# Patient Record
Sex: Male | Born: 1975 | Race: Black or African American | Hispanic: No | Marital: Married | Smoking: Current every day smoker
Health system: Southern US, Community
[De-identification: ages and names within clinical notes are randomized; demographics above are authoritative.]

## PROBLEM LIST (undated history)

## (undated) DIAGNOSIS — G4730 Sleep apnea, unspecified: Secondary | ICD-10-CM

## (undated) DIAGNOSIS — I10 Essential (primary) hypertension: Secondary | ICD-10-CM

---

## 2020-03-18 ENCOUNTER — Inpatient Hospital Stay
Admission: EM | Admit: 2020-03-18 | Discharge: 2020-03-19 | DRG: 392 | Discharge status: Against Medical Advice | Attending: Family Medicine | Admitting: Family Medicine

## 2020-03-18 ENCOUNTER — Other Ambulatory Visit: Payer: Self-pay

## 2020-03-18 ENCOUNTER — Encounter: Payer: Self-pay | Admitting: Emergency Medicine

## 2020-03-18 ENCOUNTER — Emergency Department

## 2020-03-18 DIAGNOSIS — K219 Gastro-esophageal reflux disease without esophagitis: Secondary | ICD-10-CM | POA: Diagnosis present

## 2020-03-18 DIAGNOSIS — I10 Essential (primary) hypertension: Secondary | ICD-10-CM

## 2020-03-18 DIAGNOSIS — R197 Diarrhea, unspecified: Secondary | ICD-10-CM | POA: Diagnosis present

## 2020-03-18 DIAGNOSIS — R112 Nausea with vomiting, unspecified: Secondary | ICD-10-CM | POA: Diagnosis not present

## 2020-03-18 DIAGNOSIS — G4730 Sleep apnea, unspecified: Secondary | ICD-10-CM | POA: Diagnosis present

## 2020-03-18 DIAGNOSIS — M109 Gout, unspecified: Secondary | ICD-10-CM | POA: Diagnosis present

## 2020-03-18 DIAGNOSIS — K56609 Unspecified intestinal obstruction, unspecified as to partial versus complete obstruction: Secondary | ICD-10-CM

## 2020-03-18 DIAGNOSIS — A09 Infectious gastroenteritis and colitis, unspecified: Secondary | ICD-10-CM | POA: Diagnosis present

## 2020-03-18 DIAGNOSIS — Z20822 Contact with and (suspected) exposure to covid-19: Secondary | ICD-10-CM | POA: Diagnosis present

## 2020-03-18 DIAGNOSIS — F17200 Nicotine dependence, unspecified, uncomplicated: Secondary | ICD-10-CM | POA: Diagnosis present

## 2020-03-18 HISTORY — DX: Essential (primary) hypertension: I10

## 2020-03-18 HISTORY — DX: Sleep apnea, unspecified: G47.30

## 2020-03-18 LAB — CBC
HCT: 48.6 % (ref 39.0–52.0)
HCT: 46.3 % (ref 39.0–52.0)
Hemoglobin: 15.3 g/dL (ref 13.0–17.0)
Hemoglobin: 14.6 g/dL (ref 13.0–17.0)
MCH: 23.3 pg — ABNORMAL LOW (ref 26.0–34.0)
MCH: 23.4 pg — ABNORMAL LOW (ref 26.0–34.0)
MCHC: 31.5 g/dL (ref 30.0–36.0)
MCHC: 31.5 g/dL (ref 30.0–36.0)
MCV: 73.9 fL — ABNORMAL LOW (ref 80.0–100.0)
MCV: 74.2 fL — ABNORMAL LOW (ref 80.0–100.0)
Platelets: 311 10*3/uL (ref 150–400)
Platelets: 280 K/uL (ref 150–400)
RBC: 6.58 MIL/uL — ABNORMAL HIGH (ref 4.22–5.81)
RBC: 6.24 MIL/uL — ABNORMAL HIGH (ref 4.22–5.81)
RDW: 15.8 % — ABNORMAL HIGH (ref 11.5–15.5)
RDW: 15.1 % (ref 11.5–15.5)
WBC: 7.8 10*3/uL (ref 4.0–10.5)
WBC: 7.7 K/uL (ref 4.0–10.5)
nRBC: 0 % (ref 0.0–0.2)
nRBC: 0 % (ref 0.0–0.2)

## 2020-03-18 LAB — URINALYSIS, COMPLETE (UACMP) WITH MICROSCOPIC
Bacteria, UA: NONE SEEN
Bilirubin Urine: NEGATIVE
Glucose, UA: NEGATIVE mg/dL
Hgb urine dipstick: NEGATIVE
Ketones, ur: NEGATIVE mg/dL
Nitrite: NEGATIVE
Protein, ur: 30 mg/dL — AB
Specific Gravity, Urine: 1.038 — ABNORMAL HIGH (ref 1.005–1.030)
pH: 7 (ref 5.0–8.0)

## 2020-03-18 LAB — COMPREHENSIVE METABOLIC PANEL
ALT: 39 U/L (ref 0–44)
AST: 38 U/L (ref 15–41)
Albumin: 4 g/dL (ref 3.5–5.0)
Alkaline Phosphatase: 79 U/L (ref 38–126)
Anion gap: 12 (ref 5–15)
BUN: 11 mg/dL (ref 6–20)
CO2: 32 mmol/L (ref 22–32)
Calcium: 9.2 mg/dL (ref 8.9–10.3)
Chloride: 94 mmol/L — ABNORMAL LOW (ref 98–111)
Creatinine, Ser: 1.4 mg/dL — ABNORMAL HIGH (ref 0.61–1.24)
GFR calc Af Amer: >60 mL/min (ref >60)
GFR calc non Af Amer: 60 mL/min — ABNORMAL LOW (ref >60)
Glucose, Bld: 132 mg/dL — ABNORMAL HIGH (ref 70–99)
Potassium: 3.9 mmol/L (ref 3.5–5.1)
Sodium: 138 mmol/L (ref 135–145)
Total Bilirubin: 2.2 mg/dL — ABNORMAL HIGH (ref 0.3–1.2)
Total Protein: 8.1 g/dL (ref 6.5–8.1)

## 2020-03-18 LAB — LIPASE, BLOOD: Lipase: 34 U/L (ref 11–51)

## 2020-03-18 LAB — CREATININE, SERUM
Creatinine, Ser: 1.35 mg/dL — ABNORMAL HIGH (ref 0.61–1.24)
GFR calc Af Amer: >60 mL/min (ref >60)
GFR calc non Af Amer: >60 mL/min (ref >60)

## 2020-03-18 LAB — SARS CORONAVIRUS 2 BY RT PCR (HOSPITAL ORDER, PERFORMED IN ~~LOC~~ HOSPITAL LAB): SARS Coronavirus 2: NEGATIVE

## 2020-03-18 MED ORDER — SODIUM CHLORIDE 0.9 % IV BOLUS
1000.0000 mL | Freq: Once | INTRAVENOUS | Status: AC
  Administered 2020-03-18: 1000 mL via INTRAVENOUS

## 2020-03-18 MED ORDER — SODIUM CHLORIDE 0.9 % IV SOLN
1000.0000 mL | Freq: Once | INTRAVENOUS | Status: AC
  Administered 2020-03-18: 1000 mL via INTRAVENOUS

## 2020-03-18 MED ORDER — MORPHINE SULFATE (PF) 2 MG/ML IV SOLN: 2.0000 mg | INTRAVENOUS | Status: DC | PRN

## 2020-03-18 MED ORDER — MORPHINE SULFATE (PF) 4 MG/ML IV SOLN
4.0000 mg | Freq: Once | INTRAVENOUS | Status: AC
  Administered 2020-03-18: 4 mg via INTRAVENOUS
  Filled 2020-03-18: qty 1

## 2020-03-18 MED ORDER — LORATADINE 10 MG PO TABS
10.0000 mg | ORAL_TABLET | Freq: Every day | ORAL | Status: DC
  Administered 2020-03-18 – 2020-03-19 (×2): 10 mg via ORAL
  Filled 2020-03-18 (×2): qty 1

## 2020-03-18 MED ORDER — POTASSIUM CHLORIDE 2 MEQ/ML IV SOLN
INTRAVENOUS | Status: DC
  Filled 2020-03-18 (×12): qty 1000

## 2020-03-18 MED ORDER — PANTOPRAZOLE SODIUM 40 MG IV SOLR
40.0000 mg | Freq: Every day | INTRAVENOUS | Status: DC
  Administered 2020-03-18: 40 mg via INTRAVENOUS
  Filled 2020-03-18: qty 40

## 2020-03-18 MED ORDER — PROMETHAZINE HCL 25 MG PO TABS: 12.5000 mg | ORAL_TABLET | Freq: Four times a day (QID) | ORAL | Status: DC | PRN

## 2020-03-18 MED ORDER — LOSARTAN POTASSIUM 50 MG PO TABS
100.0000 mg | ORAL_TABLET | Freq: Every day | ORAL | Status: DC
  Administered 2020-03-18 – 2020-03-19 (×2): 100 mg via ORAL
  Filled 2020-03-18 (×2): qty 2

## 2020-03-18 MED ORDER — ONDANSETRON HCL 4 MG/2ML IJ SOLN
4.0000 mg | Freq: Once | INTRAMUSCULAR | Status: AC
  Administered 2020-03-18: 4 mg via INTRAVENOUS
  Filled 2020-03-18: qty 2

## 2020-03-18 MED ORDER — AMLODIPINE BESYLATE 10 MG PO TABS
10.0000 mg | ORAL_TABLET | Freq: Every day | ORAL | Status: DC
  Administered 2020-03-18 – 2020-03-19 (×2): 10 mg via ORAL
  Filled 2020-03-18: qty 2
  Filled 2020-03-18: qty 1

## 2020-03-18 MED ORDER — ENOXAPARIN SODIUM 40 MG/0.4ML ~~LOC~~ SOLN
40.0000 mg | Freq: Two times a day (BID) | SUBCUTANEOUS | Status: DC
  Filled 2020-03-18 (×2): qty 0.4

## 2020-03-18 MED ORDER — IOHEXOL 300 MG/ML  SOLN
125.0000 mL | Freq: Once | INTRAMUSCULAR | Status: AC | PRN
  Administered 2020-03-18: 125 mL via INTRAVENOUS
  Filled 2020-03-18: qty 125

## 2020-03-18 NOTE — ED Triage Notes (Signed)
Pt reports abd pain since Friday am. Pt reports pain is constant, all over abd and crampy in nature.

## 2020-03-18 NOTE — ED Notes (Signed)
Pt being transported to CT

## 2020-03-18 NOTE — ED Notes (Signed)
Per Dr. Luberta Robertson, pt does not need gastric tube at this time

## 2020-03-18 NOTE — Consult Note (Signed)
SURGICAL CONSULTATION NOTE   HISTORY OF PRESENT ILLNESS (HPI):  44 y.o. male presented to [facility] ED for evaluation of abdominal pain, nausea and vomiting. Patient reports he was having a sleep apnea study with CPAP on Friday.  In the morning after the study he started having pain in the anterior abdomen.  The pain does not radiate to the probably body.  There has been no alleviating or aggravating factor.  The patient reported having associated nausea and vomiting and diarrhea.  The patient does endorses decreasing flatus but still passing flatus.  Patient denies fever.  Patient cannot recall anything that he ate recently that aggravate his pain.  At the ED he had labs that shows elevated creatinine.  No significant leukocytosis, normal hemoglobin.  The patient also had CT scan that shows small bowel dilation and transition point in a short segment of the small bowel with hyperenhancement.  I personally evaluated the images.  There is no free air or free fluid.  Surgery is consulted by Dr. Cyril Loosen in this context for evaluation and management of small bowel obstruction due to enteritis.  PAST MEDICAL HISTORY (PMH):  Past Medical History:  Diagnosis Date  . Hypertension   . Sleep apnea      PAST SURGICAL HISTORY (PSH):  History reviewed. No pertinent surgical history.   MEDICATIONS:  Prior to Admission medications   Not on File     ALLERGIES:  No Known Allergies   SOCIAL HISTORY:  Social History   Socioeconomic History  . Marital status: Married    Spouse name: Not on file  . Number of children: Not on file  . Years of education: Not on file  . Highest education level: Not on file  Occupational History  . Not on file  Tobacco Use  . Smoking status: Not on file  Substance and Sexual Activity  . Alcohol use: Not on file  . Drug use: Not on file  . Sexual activity: Not on file  Other Topics Concern  . Not on file  Social History Narrative  . Not on file   Social  Determinants of Health   Financial Resource Strain:   . Difficulty of Paying Living Expenses:   Food Insecurity:   . Worried About Programme researcher, broadcasting/film/video in the Last Year:   . Barista in the Last Year:   Transportation Needs:   . Freight forwarder (Medical):   Marland Kitchen Lack of Transportation (Non-Medical):   Physical Activity:   . Days of Exercise per Week:   . Minutes of Exercise per Session:   Stress:   . Feeling of Stress :   Social Connections:   . Frequency of Communication with Friends and Family:   . Frequency of Social Gatherings with Friends and Family:   . Attends Religious Services:   . Active Member of Clubs or Organizations:   . Attends Banker Meetings:   Marland Kitchen Marital Status:   Intimate Partner Violence:   . Fear of Current or Ex-Partner:   . Emotionally Abused:   Marland Kitchen Physically Abused:   . Sexually Abused:       FAMILY HISTORY:  No family history on file.   REVIEW OF SYSTEMS:  Constitutional: denies weight loss, fever, chills, or sweats  Eyes: denies any other vision changes, history of eye injury  ENT: denies sore throat, hearing problems  Respiratory: denies shortness of breath, wheezing  Cardiovascular: denies chest pain, palpitations  Gastrointestinal: Positive abdominal pain, nausea  and vomiting Genitourinary: denies burning with urination or urinary frequency Musculoskeletal: denies any other joint pains or cramps  Skin: denies any other rashes or skin discolorations  Neurological: denies any other headache, dizziness, weakness  Psychiatric: denies any other depression, anxiety   All other review of systems were negative   VITAL SIGNS:  Temp:  [98.3 F (36.8 C)] 98.3 F (36.8 C) (07/25 0956) Pulse Rate:  [88] 88 (07/25 0956) Resp:  [20] 20 (07/25 0956) BP: (144)/(98) 144/98 (07/25 0956) SpO2:  [94 %] 94 % (07/25 0956) Weight:  [145.6 kg] 145.6 kg (07/25 0954)     Height: 6' (182.9 cm) Weight: (!) 145.6 kg BMI (Calculated): 43.53    INTAKE/OUTPUT:  This shift: No intake/output data recorded.  Last 2 shifts: @IOLAST2SHIFTS @   PHYSICAL EXAM:  Constitutional:  -- Normal body habitus  -- Awake, alert, and oriented x3  Eyes:  -- Pupils equally round and reactive to light  -- No scleral icterus  Ear, nose, and throat:  -- No jugular venous distension  Pulmonary:  -- No crackles  -- Equal breath sounds bilaterally -- Breathing non-labored at rest Cardiovascular:  -- S1, S2 present  -- No pericardial rubs Gastrointestinal:  -- Abdomen soft, nontender, non-distended, no guarding or rebound tenderness -- No abdominal masses appreciated, pulsatile or otherwise  Musculoskeletal and Integumentary:  -- Wounds: None appreciated -- Extremities: B/L UE and LE FROM, hands and feet warm, no edema  Neurologic:  -- Motor function: intact and symmetric -- Sensation: intact and symmetric   Labs:  CBC Latest Ref Rng & Units 03/18/2020  WBC 4.0 - 10.5 K/uL 7.8  Hemoglobin 13.0 - 17.0 g/dL 03/20/2020  Hematocrit 39 - 52 % 48.6  Platelets 150 - 400 K/uL 311   CMP Latest Ref Rng & Units 03/18/2020  Glucose 70 - 99 mg/dL 03/20/2020)  BUN 6 - 20 mg/dL 11  Creatinine 935(T - 0.17 mg/dL 7.93)  Sodium 9.03(E - 092 mmol/L 138  Potassium 3.5 - 5.1 mmol/L 3.9  Chloride 98 - 111 mmol/L 94(L)  CO2 22 - 32 mmol/L 32  Calcium 8.9 - 10.3 mg/dL 9.2  Total Protein 6.5 - 8.1 g/dL 8.1  Total Bilirubin 0.3 - 1.2 mg/dL 2.2(H)  Alkaline Phos 38 - 126 U/L 79  AST 15 - 41 U/L 38  ALT 0 - 44 U/L 39   Imaging studies:  EXAM: CT ABDOMEN AND PELVIS WITH CONTRAST  TECHNIQUE: Multidetector CT imaging of the abdomen and pelvis was performed using the standard protocol following bolus administration of intravenous contrast.  CONTRAST:  330 OMNIPAQUE IOHEXOL 300 MG/ML  SOLN  COMPARISON:  No priors.  FINDINGS: Lower chest: Dependent subsegmental atelectasis in the lower lobes of the lungs bilaterally.  Hepatobiliary: Diffuse low  attenuation throughout the hepatic parenchyma, indicative of hepatic steatosis. No discrete cystic or solid hepatic lesions. No intra or extrahepatic biliary ductal dilatation. Gallbladder is normal in appearance.  Pancreas: No pancreatic mass. No pancreatic ductal dilatation. No pancreatic or peripancreatic fluid collections or inflammatory changes.  Spleen: Unremarkable.  Adrenals/Urinary Tract: Subcentimeter low-attenuation lesion in the lower pole of the right kidney, too small to characterize, but statistically likely to represent a cyst. Left kidney and bilateral adrenal glands are normal in appearance. No hydroureteronephrosis. Urinary bladder is unremarkable in appearance.  Stomach/Bowel: The appearance of the stomach is normal. Multiple prominent borderline dilated and mildly dilated loops of small bowel are noted, predominantly the jejunum, measuring up to 4.3 cm in diameter with some air-fluid  levels. More distal aspect of the small bowel and the colon are otherwise nearly completely decompressed. On coronal images 23-41 of series 5 there is a transition from dilated to nondilated small bowel. The cause of this transition is of uncertain on today's examination there are some portions of the small bowel in the right lower quadrant of the abdomen which demonstrate apparent increased mucosal enhancement and probable mural thickening, best appreciated on axial images 70-87 of series 2. Terminal ileum is grossly unremarkable in appearance. Normal appendix.  Vascular/Lymphatic: Atherosclerosis in the pelvic vasculature. No evidence of aneurysm or dissection in the abdominal or pelvic vasculature. Retroaortic left renal vein (normal anatomical variant) incidentally noted. No lymphadenopathy noted in the abdomen or pelvis.  Reproductive: Prostate gland and seminal vesicles are unremarkable in appearance.  Other: No significant volume of ascites.  No  pneumoperitoneum.  Musculoskeletal: There are no aggressive appearing lytic or blastic lesions noted in the visualized portions of the skeleton.  IMPRESSION: 1. Findings are compatible with small bowel obstruction, as detailed above. A transition point is identified, although the exact cause of obstruction is uncertain on today's examination. There are some areas in the ileum which demonstrate apparent mural thickening and mucosal hyperenhancement, without direct involvement of the terminal ileum itself. Findings could suggest a small bowel enteritis, but given the lack of involvement of the terminal ileum, Crohn's disease is not favored. Further clinical evaluation is recommended. 2. Hepatic steatosis. 3. Atherosclerosis.   Electronically Signed   By: Trudie Reed M.D.   On: 03/18/2020 13:31  Assessment/Plan:  44 y.o. male with enteritis causing small bowel obstruction, complicated by pertinent comorbidities including sleep apnea.  Patient presenting with clinical symptoms of small bowel obstruction.  I considered that the cause of the small bowel obstruction without any previous abdominal surgery and a segmental area of the small intestine with hyperenhancement is due to enteritis.  Differential diagnosis is infectious versus inflammatory enteritis.  I recommend to place NGT due to patient abdominal distention and nausea.  Agree with IV hydration.  This will help relieving the distention and pain and avoid vomiting.  I also recommend internal medicine evaluation and initial medical management of enteritis with further work-up of the differential diagnosis.  I will follow along and be available if any surgical management is needed.  Gae Gallop, MD

## 2020-03-18 NOTE — ED Triage Notes (Signed)
Pt reports last BM was a couple of days ago which is not normal for him so he thinks he may be constipated.

## 2020-03-18 NOTE — ED Provider Notes (Addendum)
[facility] Emergency Department Provider Note   ____________________________________________    I have reviewed the triage vital signs and the nursing notes.   HISTORY  Chief Complaint Abdominal Pain     HPI Dale Nolan is a 44 y.o. male who presents with complaints of abdominal pain.  Patient describes 3 days of worsening abdominal pain with bloating and distention.  Unable to have bowel movement, not passing gas.  Positive nausea and vomiting.  Has never had this before.  Denies a history of abdominal surgery.  Has not take anything for this.  Denies fevers or chills.  No sick contacts reported.  Past Medical History:  Diagnosis Date  . Hypertension   . Sleep apnea     There are no problems to display for this patient.   History reviewed. No pertinent surgical history.  Prior to Admission medications   Not on File     Allergies Patient has no known allergies.  No family history on file.  Social History No recent drug or alcohol use Review of Systems  Constitutional: No fever/chills Eyes: No visual changes.  ENT: No sore throat. Cardiovascular: Denies chest pain. Respiratory: Denies shortness of breath. Gastrointestinal: As above Genitourinary: Negative for dysuria. Musculoskeletal: Negative for back pain. Skin: Negative for rash. Neurological: Negative for headaches    ____________________________________________   PHYSICAL EXAM:  VITAL SIGNS: ED Triage Vitals  Enc Vitals Group     BP 03/18/20 0956 (!) 144/98     Pulse Rate 03/18/20 0956 88     Resp 03/18/20 0956 20     Temp 03/18/20 0956 98.3 F (36.8 C)     Temp Source 03/18/20 0956 Oral     SpO2 03/18/20 0956 94 %     Weight 03/18/20 0954 (!) 145.6 kg (321 lb)     Height 03/18/20 0954 1.829 m (6')     Head Circumference --      Peak Flow --      Pain Score 03/18/20 0954 9     Pain Loc --      Pain Edu? --      Excl. in GC? --     Constitutional:  Alert and oriented.   Nose: No congestion/rhinnorhea. Mouth/Throat: Mucous membranes are moist.    Cardiovascular: Normal rate, regular rhythm. Grossly normal heart sounds.  Good peripheral circulation. Respiratory: Normal respiratory effort.  No retractions. Lungs CTAB. Gastrointestinal: Soft, mild tenderness palpation in the upper abdomen and left lower quadrant, positive distention  Musculoskeletal: No lower extremity tenderness nor edema.  Warm and well perfused Neurologic:  Normal speech and language. No gross focal neurologic deficits are appreciated.  Skin:  Skin is warm, dry and intact. No rash noted. Psychiatric: Mood and affect are normal. Speech and behavior are normal.  ____________________________________________   LABS (all labs ordered are listed, but only abnormal results are displayed)  Labs Reviewed  COMPREHENSIVE METABOLIC PANEL - Abnormal; Notable for the following components:      Result Value   Chloride 94 (*)    Glucose, Bld 132 (*)    Creatinine, Ser 1.40 (*)    Total Bilirubin 2.2 (*)    GFR calc non Af Amer 60 (*)    All other components within normal limits  CBC - Abnormal; Notable for the following components:   RBC 6.58 (*)    MCV 73.9 (*)    MCH 23.3 (*)    RDW 15.8 (*)    All other components within  normal limits  SARS CORONAVIRUS 2 BY RT PCR (HOSPITAL ORDER, PERFORMED IN [facility])  LIPASE, BLOOD  URINALYSIS, COMPLETE (UACMP) WITH MICROSCOPIC   ____________________________________________  EKG  ED ECG REPORT I, Jene Every, the attending physician, personally viewed and interpreted this ECG.  Date: 03/18/2020  Rhythm: normal sinus rhythm QRS Axis: normal Intervals: normal ST/T Wave abnormalities: normal Narrative Interpretation: no evidence of acute ischemia  ____________________________________________  RADIOLOGY  CT abdomen pelvis read by radiology as consistent with small bowel  obstruction ____________________________________________   PROCEDURES  Procedure(s) performed: No  Procedures   Critical Care performed: No ____________________________________________   INITIAL IMPRESSION / ASSESSMENT AND PLAN / ED COURSE  Pertinent labs & imaging results that were available during my care of the patient were reviewed by me and considered in my medical decision making (see chart for details).  Patient presents with abdominal pain, distention as above.  Differential includes small bowel obstruction, enteritis, colitis  Lab work is notable for mild elevation of creatinine, normal lipase, normal white blood cell count  We will treat with IV morphine, IV Zofran, IV fluids and obtain CT abdomen pelvis.  CT scan is consistent with small bowel obstruction, have consulted Dr. Maia Plan of general surgery  Patient seen in the emergency department by Dr. Maia Plan, recommends medical admission for evaluation of possible enteritis/inflammatory bowel disease  Discussed with Dr. Luberta Robertson of internal medicine who will admit the patient    ____________________________________________   FINAL CLINICAL IMPRESSION(S) / ED DIAGNOSES  Final diagnoses:  Small bowel obstruction (HCC)        Note:  This document was prepared using Dragon voice recognition software and may include unintentional dictation errors.   Jene Every, MD 03/18/20 1431    Jene Every, MD 03/18/20 1527    Jene Every, MD 03/18/20 [phone]

## 2020-03-18 NOTE — ED Notes (Signed)
Explained to patient Lovenox purpose, pt still declined to take at this time.

## 2020-03-18 NOTE — H&P (Signed)
History and Physical:    Dale Nolan   UXN:[id number] DOB: 12-07-1975 DOA: 03/18/2020  Referring MD/provider: Dr. Cyril Loosen PCP: Patient, No Pcp Per   Patient coming from: Home  Chief Complaint: Abdominal pain, nausea vomiting and diarrhea  History of Present Illness:   Dale Nolan is an 44 y.o. male with PMH significant for HTN, GERD who was in his usual state of reasonable health until 3 to 4 days prior to admission when he noted onset of abdominal pain which was associated with onset of nausea vomiting and diarrhea.  Patient self treated conservatively for 2 days thinking that he most likely had a "stomach bug".  However patient's abdominal pain continued to worsen and patient noted he was unable to eat or drink anything.  Patient drove here from his home in [place] to pick up his kids and because the abdominal pain was so severe, he presented to the ED for evaluation.  Patient continues to have intermittent diarrhea.  He has not vomited for the past 24 hours however has not had much to eat or drink since then either.  Patient denies fevers or chills.  Patient states he felt well until he suddenly developed abdominal pain nausea vomiting and diarrhea 3 days ago.  Since then he denies any fevers chills or malaise.  He does have that abdominal pain which is not necessarily improved with the diarrhea.  Patient denies bloody diarrhea.  Patient denies blood in the vomitus.  Patient denies any sick contacts and has not had any indication this is food poisoning as his wife is eaten the same thing that he has.  Of note patient lives in [place] and is followed at the [facility].  He apparently is scheduled for colonoscopy tomorrow.  He is only here to pick up his 37-year-old twins who are visiting his mother.  ED Course:  The patient was noted to be afebrile with normal WBC.  Patient underwent a CT which was consistent with small bowel obstruction with transition point of unclear  etiology.  He was noted to have thickening of his ileum not including his terminal ileum suggestive of a small bowel enteritis.  Patient was seen by general surgery who recommended conservative management with admission to hospitalist service.  They will follow with [place].  ROS:   ROS   Review of Systems: General: Denies fever, chills, malaise,  Eyes: Denies recent change in vision, no discharge, redness, pain noted Endocrine: Denies heat/cold intolerance, polyuria or weight loss. Respiratory: Denies cough, SOB at rest or hemoptysis Cardiovascular: Denies chest pain or palpitations GU: Denies dysuria, frequency or hematuria CNS: Denies HA, dizziness, confusion, new weakness or clumsiness. Blood/lymphatics: Denies easy bruising or bleeding Mood/affect: Denies anxiety/depression    Past Medical History:   Past Medical History:  Diagnosis Date  . Hypertension   . Sleep apnea     Past Surgical History:   History reviewed. No pertinent surgical history.  Social History:   Social History   Socioeconomic History  . Marital status: Married    Spouse name: Not on file  . Number of children: Not on file  . Years of education: Not on file  . Highest education level: Not on file  Occupational History  . Not on file  Tobacco Use  . Smoking status: Not on file  Substance and Sexual Activity  . Alcohol use: Not on file  . Drug use: Not on file  . Sexual activity: Not on file  Other Topics Concern  .  Not on file  Social History Narrative  . Not on file   Social Determinants of Health   Financial Resource Strain:   . Difficulty of Paying Living Expenses:   Food Insecurity:   . Worried About Programme researcher, broadcasting/film/video in the Last Year:   . Barista in the Last Year:   Transportation Needs:   . Freight forwarder (Medical):   Marland Kitchen Lack of Transportation (Non-Medical):   Physical Activity:   . Days of Exercise per Week:   . Minutes of Exercise per Session:   Stress:   .  Feeling of Stress :   Social Connections:   . Frequency of Communication with Friends and Family:   . Frequency of Social Gatherings with Friends and Family:   . Attends Religious Services:   . Active Member of Clubs or Organizations:   . Attends Banker Meetings:   Marland Kitchen Marital Status:   Intimate Partner Violence:   . Fear of Current or Ex-Partner:   . Emotionally Abused:   Marland Kitchen Physically Abused:   . Sexually Abused:     Allergies   Patient has no known allergies.  Family history:   No family history on file.  Current Medications:   Prior to Admission medications   Medication Sig Start Date End Date Taking? Authorizing Provider  amLODipine (NORVASC) 10 MG tablet Take 10 mg by mouth daily.   Yes [provider]  cholecalciferol (VITAMIN D3) 25 MCG (1000 UNIT) tablet Take 1,000 Units by mouth daily.   Yes [provider]  colchicine 0.6 MG tablet Take 0.6 mg by mouth daily.   Yes [provider]  loratadine (CLARITIN) 10 MG tablet Take 10 mg by mouth daily.   Yes [provider]  losartan (COZAAR) 100 MG tablet Take 100 mg by mouth daily.   Yes [provider]  omeprazole (PRILOSEC) 40 MG capsule Take 40 mg by mouth daily.   Yes [provider]    Physical Exam:   Vitals:   03/18/20 0954 03/18/20 0956  BP:  (!) 144/98  Pulse:  88  Resp:  20  Temp:  98.3 F (36.8 C)  TempSrc:  Oral  SpO2:  94%  Weight: (!) 145.6 kg   Height: 6' (1.829 m)      Physical Exam: Blood pressure (!) 144/98, pulse 88, temperature 98.3 F (36.8 C), temperature source Oral, resp. rate 20, height 6' (1.829 m), weight (!) 145.6 kg, SpO2 94 %. Gen: Relatively well-appearing patient with very dry mucous membranes lying in bed with some difficulty breathing while lying flat which improved with sitting up. Eyes: sclera anicteric, conjuctiva mildly injected bilaterally CVS: S1-S2, regulary, no gallops Respiratory:  decreased air entry  likely secondary to decreased inspiratory effort GI: Abdomen is distended, firm but not hard.  Minimal tenderness to deep palpation diffusely.  No rebound tenderness.  He does have hypoactive bowel sounds. LE: No edema. No cyanosis Neuro: A/O x 3, Moving all extremities equally with normal strength, CN 3-12 intact, grossly nonfocal.  Psych: patient is logical and coherent, judgement and insight appear normal, mood and affect appropriate to situation. Skin: no rashes or lesions or ulcers,    Data Review:    Labs: Basic Metabolic Panel: Recent Labs  Lab 03/18/20 1001  NA 138  K 3.9  CL 94*  CO2 32  GLUCOSE 132*  BUN 11  CREATININE 1.40*  CALCIUM 9.2   Liver Function Tests: Recent Labs  Lab  03/18/20 1001  AST 38  ALT 39  ALKPHOS 79  BILITOT 2.2*  PROT 8.1  ALBUMIN 4.0   Recent Labs  Lab 03/18/20 1001  LIPASE 34   No results for input(s): AMMONIA in the last 168 hours. CBC: Recent Labs  Lab 03/18/20 1001  WBC 7.8  HGB 15.3  HCT 48.6  MCV 73.9*  PLT 311   Cardiac Enzymes: No results for input(s): CKTOTAL, CKMB, CKMBINDEX, TROPONINI in the last 168 hours.  BNP (last 3 results) No results for input(s): PROBNP in the last 8760 hours. CBG: No results for input(s): GLUCAP in the last 168 hours.  Urinalysis No results found for: COLORURINE, APPEARANCEUR, LABSPEC, PHURINE, GLUCOSEU, HGBUR, BILIRUBINUR, KETONESUR, PROTEINUR, UROBILINOGEN, NITRITE, LEUKOCYTESUR    Radiographic Studies: CT ABDOMEN PELVIS W CONTRAST  Result Date: 03/18/2020 CLINICAL DATA:  44 year old male with history of abdominal distension. Suspected bowel obstruction. EXAM: CT ABDOMEN AND PELVIS WITH CONTRAST TECHNIQUE: Multidetector CT imaging of the abdomen and pelvis was performed using the standard protocol following bolus administration of intravenous contrast. CONTRAST:  125mL OMNIPAQUE IOHEXOL 300 MG/ML  SOLN COMPARISON:  No priors. FINDINGS: Lower chest: Dependent subsegmental  atelectasis in the lower lobes of the lungs bilaterally. Hepatobiliary: Diffuse low attenuation throughout the hepatic parenchyma, indicative of hepatic steatosis. No discrete cystic or solid hepatic lesions. No intra or extrahepatic biliary ductal dilatation. Gallbladder is normal in appearance. Pancreas: No pancreatic mass. No pancreatic ductal dilatation. No pancreatic or peripancreatic fluid collections or inflammatory changes. Spleen: Unremarkable. Adrenals/Urinary Tract: Subcentimeter low-attenuation lesion in the lower pole of the right kidney, too small to characterize, but statistically likely to represent a cyst. Left kidney and bilateral adrenal glands are normal in appearance. No hydroureteronephrosis. Urinary bladder is unremarkable in appearance. Stomach/Bowel: The appearance of the stomach is normal. Multiple prominent borderline dilated and mildly dilated loops of small bowel are noted, predominantly the jejunum, measuring up to 4.3 cm in diameter with some air-fluid levels. More distal aspect of the small bowel and the colon are otherwise nearly completely decompressed. On coronal images 23-41 of series 5 there is a transition from dilated to nondilated small bowel. The cause of this transition is of uncertain on today's examination there are some portions of the small bowel in the right lower quadrant of the abdomen which demonstrate apparent increased mucosal enhancement and probable mural thickening, best appreciated on axial images 70-87 of series 2. Terminal ileum is grossly unremarkable in appearance. Normal appendix. Vascular/Lymphatic: Atherosclerosis in the pelvic vasculature. No evidence of aneurysm or dissection in the abdominal or pelvic vasculature. Retroaortic left renal vein (normal anatomical variant) incidentally noted. No lymphadenopathy noted in the abdomen or pelvis. Reproductive: Prostate gland and seminal vesicles are unremarkable in appearance. Other: No significant volume of  ascites.  No pneumoperitoneum. Musculoskeletal: There are no aggressive appearing lytic or blastic lesions noted in the visualized portions of the skeleton. IMPRESSION: 1. Findings are compatible with small bowel obstruction, as detailed above. A transition point is identified, although the exact cause of obstruction is uncertain on today's examination. There are some areas in the ileum which demonstrate apparent mural thickening and mucosal hyperenhancement, without direct involvement of the terminal ileum itself. Findings could suggest a small bowel enteritis, but given the lack of involvement of the terminal ileum, Crohn's disease is not favored. Further clinical evaluation is recommended. 2. Hepatic steatosis. 3. Atherosclerosis. Electronically Signed   By: Trudie Reedaniel  Entrikin M.D.   On: 03/18/2020 13:31    EKG: Independently reviewed.  Sinus rhythm at 90.  Normal intervals.  Normal axis.  Isolated Q in 3.  No acute ST-T wave changes.   Assessment/Plan:   Principal Problem:   SBO (small bowel obstruction) (HCC) Active Problems:   HTN (hypertension)   Diarrhea   44 year old generally healthy man presents with acute onset of nausea vomiting and diarrhea and CT scan suggestive of small bowel obstruction.  Of note patient lives in [place] and gets his care at a [facility] in [place].  He has only here temporarily visiting his family.   SBO Patient with small bowel obstruction on CT scan of unclear etiology.   Will treat SBO conservatively with bowel rest and IV fluids Hold off on NG tube at present as patient has not had vomiting for more than 24 hours. As needed Zofran and promethazine As needed morphine for pain management  Diarrhea CT scan demonstrates thickening of ileum not including terminal ileum. Patient denies history of IBD although he has known "IBS". Patient's mother may or may not have IBD, she has apparently had a bowel resection for unknown reasons. Will request GI evaluation  for possible initial presentation of IBD versus infectious diarrhea We will check lactoferrin and GI panel on stool sample to evaluate for infectious diarrhea.  HTN Continue amlodipine and losartan  GERD Change p.o. omeprazole to IV pantoprazole  Gout Hold colchicine   Other information:   DVT prophylaxis: Enoxaparin ordered. Code Status: Full Family Communication: Patient states he will communicate with his wife Disposition Plan: Home Consults called: Gastroenterology to see in the morning Admission status: Inpatient  Sahalie Beth Tublu Naturi Alarid [facility]  If 7PM-7AM, please contact night-coverage [email or web address] Password [facility] 03/18/2020, 4:55 PM

## 2020-03-19 ENCOUNTER — Inpatient Hospital Stay

## 2020-03-19 DIAGNOSIS — R112 Nausea with vomiting, unspecified: Secondary | ICD-10-CM

## 2020-03-19 DIAGNOSIS — R197 Diarrhea, unspecified: Secondary | ICD-10-CM

## 2020-03-19 LAB — GASTROINTESTINAL PANEL BY PCR, STOOL (REPLACES STOOL CULTURE)

## 2020-03-19 LAB — BASIC METABOLIC PANEL
Anion gap: 7 (ref 5–15)
BUN: 10 mg/dL (ref 6–20)
CO2: 30 mmol/L (ref 22–32)
Calcium: 7.8 mg/dL — ABNORMAL LOW (ref 8.9–10.3)
Chloride: 103 mmol/L (ref 98–111)
Creatinine, Ser: 1.28 mg/dL — ABNORMAL HIGH (ref 0.61–1.24)
GFR calc Af Amer: >60 mL/min (ref >60)
GFR calc non Af Amer: >60 mL/min (ref >60)
Glucose, Bld: 93 mg/dL (ref 70–99)
Potassium: 3.4 mmol/L — ABNORMAL LOW (ref 3.5–5.1)
Sodium: 140 mmol/L (ref 135–145)

## 2020-03-19 LAB — LACTOFERRIN, FECAL, QUALITATIVE: Lactoferrin, Fecal, Qual: POSITIVE — AB

## 2020-03-19 LAB — CBC
HCT: 41.9 % (ref 39.0–52.0)
Hemoglobin: 13.5 g/dL (ref 13.0–17.0)
MCH: 23.6 pg — ABNORMAL LOW (ref 26.0–34.0)
MCHC: 32.2 g/dL (ref 30.0–36.0)
MCV: 73.4 fL — ABNORMAL LOW (ref 80.0–100.0)
Platelets: 243 10*3/uL (ref 150–400)
RBC: 5.71 MIL/uL (ref 4.22–5.81)
RDW: 14.6 % (ref 11.5–15.5)
WBC: 7.3 10*3/uL (ref 4.0–10.5)
nRBC: 0 % (ref 0.0–0.2)

## 2020-03-19 LAB — MAGNESIUM: Magnesium: 1.7 mg/dL (ref 1.7–2.4)

## 2020-03-19 LAB — HIV ANTIBODY (ROUTINE TESTING W REFLEX): HIV Screen 4th Generation wRfx: NONREACTIVE

## 2020-03-19 MED ORDER — POTASSIUM CHLORIDE CRYS ER 20 MEQ PO TBCR
40.0000 meq | EXTENDED_RELEASE_TABLET | Freq: Once | ORAL | Status: AC
  Administered 2020-03-19: 40 meq via ORAL
  Filled 2020-03-19: qty 2

## 2020-03-19 NOTE — Consult Note (Signed)
Melodie Bouillon, MD [address], [place], [place], [address] [address], [place], [place], [address] Phone: [phone]  Fax: [phone]  Consultation  Referring Provider:     Dr. Caleb Popp Primary Care Physician:  Patient, No Pcp Per Reason for Consultation:    Diarrhea, SBO  Date of Admission:  03/18/2020 Date of Consultation:  03/19/2020         HPI:   Thatcher Schreifels is a 44 y.o. male who presented with 3-day history of nausea vomiting and diarrhea.  Symptoms started 3 days ago and started with vomiting along with loose stools.  No blood in stool or hematemesis.  No prior history of similar symptoms.  Since then symptoms have improved.  Emesis has not reoccurred in 3 days.  Had one solid stool today.  Loose bowel movement earlier this morning.  No prior history of IBD.  Patient states his last colonoscopy was about 10 years ago at the [facility].  States has had 3 colonoscopies in a span of 5 years with the [facility].  States these were done as he is afraid of colon cancer and prostate cancer.  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  States was scheduled for routine colonoscopy this week but since he is admitted here, it was not done.  Patient states his abdominal pain has completely resolved at this time.  No fever or chills.  Past Medical History:  Diagnosis Date  . Hypertension   . Sleep apnea     History reviewed. No pertinent surgical history.  Prior to Admission medications   Medication Sig Start Date End Date Taking? Authorizing Provider  amLODipine (NORVASC) 10 MG tablet Take 10 mg by mouth daily.   Yes [provider]  cholecalciferol (VITAMIN D3) 25 MCG (1000 UNIT) tablet Take 1,000 Units by mouth daily.   Yes [provider]  colchicine 0.6 MG tablet Take  0.6 mg by mouth daily.   Yes [provider]  loratadine (CLARITIN) 10 MG tablet Take 10 mg by mouth daily.   Yes [provider]  losartan (COZAAR) 100 MG tablet Take 100 mg by mouth daily.   Yes [provider]  omeprazole (PRILOSEC) 40 MG capsule Take 40 mg by mouth daily.   Yes [provider]    History reviewed. No pertinent family history.   Social History   Tobacco Use  . Smoking status: Current Every Day Smoker  . Smokeless tobacco: Never Used  Substance Use Topics  . Alcohol use: Not on file  . Drug use: Not on file    Allergies as of 03/18/2020  . (No Known Allergies)    Review of Systems:    All systems reviewed and negative except where noted in HPI.   Physical Exam:  Vital signs in last 24 hours: Vitals:   03/18/20 2318 03/19/20 0512 03/19/20 0752 03/19/20 1419  BP: (!) 159/98 (!) 142/92 (!) 133/108 (!) 159/94  Pulse: 71 74 68 71  Resp: 20 20  20   Temp: 98.3 F (36.8 C) 98.1 F (36.7 C)  98 F (36.7 C)  TempSrc: Oral Oral  Oral  SpO2: 92% 96%  98%  Weight: (!) 148.4 kg     Height: 6' (1.829 m)      Last BM Date: 03/19/20 General:  Pleasant, cooperative in NAD Head:  Normocephalic and atraumatic. Eyes:   No icterus.   Conjunctiva pink. PERRLA. Ears:  Normal auditory acuity. Neck:  Supple; no masses or thyroidomegaly Lungs: Respirations even and unlabored. Lungs clear to auscultation bilaterally.   No wheezes, crackles, or rhonchi.  Abdomen:  Soft, nondistended, nontender. Normal bowel sounds. No appreciable masses or hepatomegaly.  No rebound or guarding.  Neurologic:  Alert and oriented x3;  grossly normal neurologically. Skin:  Intact without significant lesions or rashes. Cervical Nodes:  No significant cervical adenopathy. Psych:  Alert and cooperative. Normal affect.  LAB RESULTS: Recent Labs    03/18/20 1001 03/18/20 1936 03/19/20 0339  WBC 7.8 7.7 7.3  HGB 15.3 14.6 13.5  HCT 48.6 46.3 41.9  PLT  311 280 243   BMET Recent Labs    03/18/20 1001 03/18/20 1936 03/19/20 0339  NA 138  --  140  K 3.9  --  3.4*  CL 94*  --  103  CO2 32  --  30  GLUCOSE 132*  --  93  BUN 11  --  10  CREATININE 1.40* 1.35* 1.28*  CALCIUM 9.2  --  7.8*   LFT Recent Labs    03/18/20 1001  PROT 8.1  ALBUMIN 4.0  AST 38  ALT 39  ALKPHOS 79  BILITOT 2.2*   PT/INR No results for input(s): LABPROT, INR in the last 72 hours.  STUDIES: CT ABDOMEN PELVIS W CONTRAST  Result Date: 03/18/2020 CLINICAL DATA:  44 year old male with history of abdominal distension. Suspected bowel obstruction. EXAM: CT ABDOMEN AND PELVIS WITH CONTRAST TECHNIQUE: Multidetector CT imaging of the abdomen and pelvis was performed using the standard protocol following bolus administration of intravenous contrast. CONTRAST:  OMNIPAQUE IOHEXOL 300 MG/ML  SOLN COMPARISON:  No priors. FINDINGS: Lower chest: Dependent subsegmental atelectasis in the lower lobes of the lungs bilaterally. Hepatobiliary: Diffuse low attenuation throughout the hepatic parenchyma, indicative of hepatic steatosis. No discrete cystic or solid hepatic lesions. No intra or extrahepatic biliary ductal dilatation. Gallbladder is normal in appearance. Pancreas: No pancreatic mass. No pancreatic ductal dilatation. No pancreatic or peripancreatic fluid collections or inflammatory changes. Spleen: Unremarkable. Adrenals/Urinary Tract: Subcentimeter low-attenuation lesion in the lower pole of the right kidney, too small to characterize, but statistically likely to represent a cyst. Left kidney and bilateral adrenal glands are normal in appearance. No hydroureteronephrosis. Urinary bladder is unremarkable in appearance. Stomach/Bowel: The appearance of the stomach is normal. Multiple prominent borderline dilated and mildly dilated loops of small bowel are noted, predominantly the jejunum, measuring up to 4.3 cm in diameter with some air-fluid levels. More distal aspect  of the small bowel and the colon are otherwise nearly completely decompressed. On coronal images 23-41 of series 5 there is a transition from dilated to nondilated small bowel. The cause of this transition is of uncertain on today's examination there are some portions of the small bowel in the right lower quadrant of the abdomen which demonstrate apparent increased mucosal enhancement and probable mural thickening, best appreciated on axial images 70-87 of series 2. Terminal ileum is grossly unremarkable in appearance. Normal appendix. Vascular/Lymphatic: Atherosclerosis in the pelvic vasculature. No evidence of aneurysm or dissection in the abdominal or pelvic vasculature. Retroaortic left renal vein (normal anatomical variant) incidentally noted. No lymphadenopathy noted in the abdomen or pelvis. Reproductive: Prostate gland and seminal vesicles are unremarkable in appearance. Other: No significant volume of ascites.  No pneumoperitoneum. Musculoskeletal: There are no aggressive appearing lytic  or blastic lesions noted in the visualized portions of the skeleton. IMPRESSION: 1. Findings are compatible with small bowel obstruction, as detailed above. A transition point is identified, although the exact cause of obstruction is uncertain on today's examination. There are some areas in the ileum which demonstrate apparent mural thickening and mucosal hyperenhancement, without direct involvement of the terminal ileum itself. Findings could suggest a small bowel enteritis, but given the lack of involvement of the terminal ileum, Crohn's disease is not favored. Further clinical evaluation is recommended. 2. Hepatic steatosis. 3. Atherosclerosis. Electronically Signed   By: Trudie Reed M.D.   On: 03/18/2020 13:31   DG Abd 2 Views  Result Date: 03/19/2020 CLINICAL DATA:  44 year old male currently admitted with small bowel obstruction EXAM: ABDOMEN - 2 VIEW COMPARISON:  CT abdomen/pelvis 03/18/2020 FINDINGS:  Persistent loops of gas-filled and mildly distended small bowel throughout to the mid abdomen. Of note, there is also substantial thickening of the valvulae conniventes consistent with submucosal edema and enteritis. No evidence of free intra-abdominal gas. The colon is normal in appearance with well distributed gas pattern. No acute osseous abnormality identified. IMPRESSION: 1. Persistent loops of gas filled and mildly dilated small bowel in the mid abdomen with evidence of submucosal edema. Primary differential considerations include both infectious and inflammatory enteritis. Electronically Signed   By: Malachy Moan M.D.   On: 03/19/2020 08:02      Impression / Plan:   Jhonatan Levi is a 44 y.o. y/o male with nausea vomiting diarrhea with CT yesterday showing small bowel obstruction  Patient's clinical symptoms are most consistent with likely infectious gastroenteritis given self-limited symptoms that have now resolved  He does not have any chronic symptoms consistent with IBD  GI profile was negative on admission, however this did not include C. Difficile  If patient has any further loose bowel movements (reports one hard stool today) would recommend C. difficile testing  If symptoms recur (resolved at this time), colonoscopy with terminal ileum intubation and evaluation can be considered to rule out IBD  Due to CT findings, would recommend surgery consult given transition point in small bowel obstruction noted on CT  If symptoms remain resolved, with no reoccurrence during inpatient admission, patient should follow-up with primary GI as an outpatient.  I have discussed this with him in detail and encouraged him to schedule a colonoscopy, with terminal ileum evaluation during the procedure.  He verbalized understanding.  Primary team to please ensure records from this admission are sent to his PCP at the [facility] as well.  No indication for colonoscopy at this time given clinically  symptoms have resolved.  In addition, abnormality was NOT noted in the terminal ileum or colon, and the areas of abnormalities will not be able to be reached via colonoscopy and air insufflation used during the procedure can worsen distension while he is already improving  Thank you for involving me in the care of this patient.      LOS: 1 day   Pasty Spillers, MD  03/19/2020, 3:33 PM

## 2020-03-19 NOTE — Progress Notes (Signed)
SURGICAL PROGRESS NOTE   Hospital Day(s): 1.   Post op day(s):  Marland Kitchen   Interval History: Patient seen and examined, feeling much better than yesterday no acute events or new complaints overnight. Patient reports having bowel movement and passing gas.  He denies any nausea or vomiting.  There is no pain radiation.  There is no alleviating or rating factor.  Vital signs in last 24 hours: [min-max] current  Temp:  [98 F (36.7 C)-98.3 F (36.8 C)] 98 F (36.7 C) (07/26 1419) Pulse Rate:  [60-81] 71 (07/26 1419) Resp:  [16-20] 20 (07/26 1419) BP: (132-169)/(87-108) 159/94 (07/26 1419) SpO2:  [92 %-98 %] 98 % (07/26 1419) Weight:  [148.4 kg] 148.4 kg (07/25 2318)     Height: 6' (182.9 cm) Weight: (!) 148.4 kg BMI (Calculated): 44.36   Physical Exam:  Constitutional: alert, cooperative and no distress  Respiratory: breathing non-labored at rest  Cardiovascular: regular rate and sinus rhythm  Gastrointestinal: soft, non-tender, and non-distended  Labs:  CBC Latest Ref Rng & Units 03/19/2020 03/18/2020 03/18/2020  WBC 4.0 - 10.5 K/uL 7.3 7.7 7.8  Hemoglobin 13.0 - 17.0 g/dL 38.4 66.5 99.3  Hematocrit 39 - 52 % 41.9 46.3 48.6  Platelets 150 - 400 K/uL 243 280 311   CMP Latest Ref Rng & Units 03/19/2020 03/18/2020 03/18/2020  Glucose 70 - 99 mg/dL 93 - 570(V)  BUN 6 - 20 mg/dL 10 - 11  Creatinine 7.79 - 1.24 mg/dL 3.90(Z) 0.09(Q) 3.30(Q)  Sodium 135 - 145 mmol/L 140 - 138  Potassium 3.5 - 5.1 mmol/L 3.4(L) - 3.9  Chloride 98 - 111 mmol/L 103 - 94(L)  CO2 22 - 32 mmol/L 30 - 32  Calcium 8.9 - 10.3 mg/dL 7.8(L) - 9.2  Total Protein 6.5 - 8.1 g/dL - - 8.1  Total Bilirubin 0.3 - 1.2 mg/dL - - 2.2(H)  Alkaline Phos 38 - 126 U/L - - 79  AST 15 - 41 U/L - - 38  ALT 0 - 44 U/L - - 39    Imaging studies: I personally evaluated the abdominal x-ray with minimal small bowel dilation.  No clinical deterioration.   Assessment/Plan:  44 y.o. male with enteritis causing small bowel obstruction,  complicated by pertinent comorbidities including sleep apnea.  Patient improving clinically.  Abdominal pain improving.  Patient passing gas, having bowel movement.  Agree with starting patient on clear liquids.  GI evaluated the patient and assess that patient clinical history most consistent with infectious enteritis.  GI recommended evaluation by surgery.  I have previously evaluated the patient since yesterday.  The transition point of concern is the area of the small bowel with enteritis.  This should resolve with medical therapy.  I will continue to follow along for the assistant of the management of this patient.  Gae Gallop, MD

## 2020-03-19 NOTE — Progress Notes (Signed)
Pt requested to be discharged after speaking with hospital team. Upon conversation with primary hospital doctor, pt was not safe to discharge yet. Pt requested to leave anyway and educated on risks of leaving against medical advice. AMA form signed and placed in chart, MD notified.

## 2020-03-20 DIAGNOSIS — A09 Infectious gastroenteritis and colitis, unspecified: Secondary | ICD-10-CM

## 2020-03-20 NOTE — Discharge Summary (Signed)
Brief Discharge Summary  Patient left AGAINST MEDICAL ADVICE  Patient presented secondary to abdominal pain, nausea, vomiting and diarrhea and was found to have concern for SBO with transition point in addition to evidence of inflammation concerning for inflammatory vs infection enteritis. GI pathogen panel negative. General surgery and GI consulted. General surgery recommended medical management and GI's assessment was that this was likely infectious in etiology. Patient continued to have multiple stools which were watery/soft. No continued abdominal pain, fever or leukocytosis so no C. Difficile panel was ordered. Plan was to advance patient's diet slowly however patient decided to leave against medical advice.  Principal Problem:   SBO (small bowel obstruction) (HCC) Active Problems:   HTN (hypertension)   Diarrhea   Infectious enteritis   Jacquelin Hawking, MD [facility] 03/20/2020, 2:35 PM

## 2021-07-22 IMAGING — CR DG ABDOMEN 2V
1 series · 4 of 4 positions shown · non-contrast
Comparison: CT abdomen/pelvis 03/18/2020

CLINICAL DATA: 44-year-old male currently admitted with small bowel
obstruction

EXAM:
ABDOMEN - 2 VIEW

[Series 1: dg abd 2 views · 0.14mm/px · 4 of 4 slices shown]
[im 1/4]
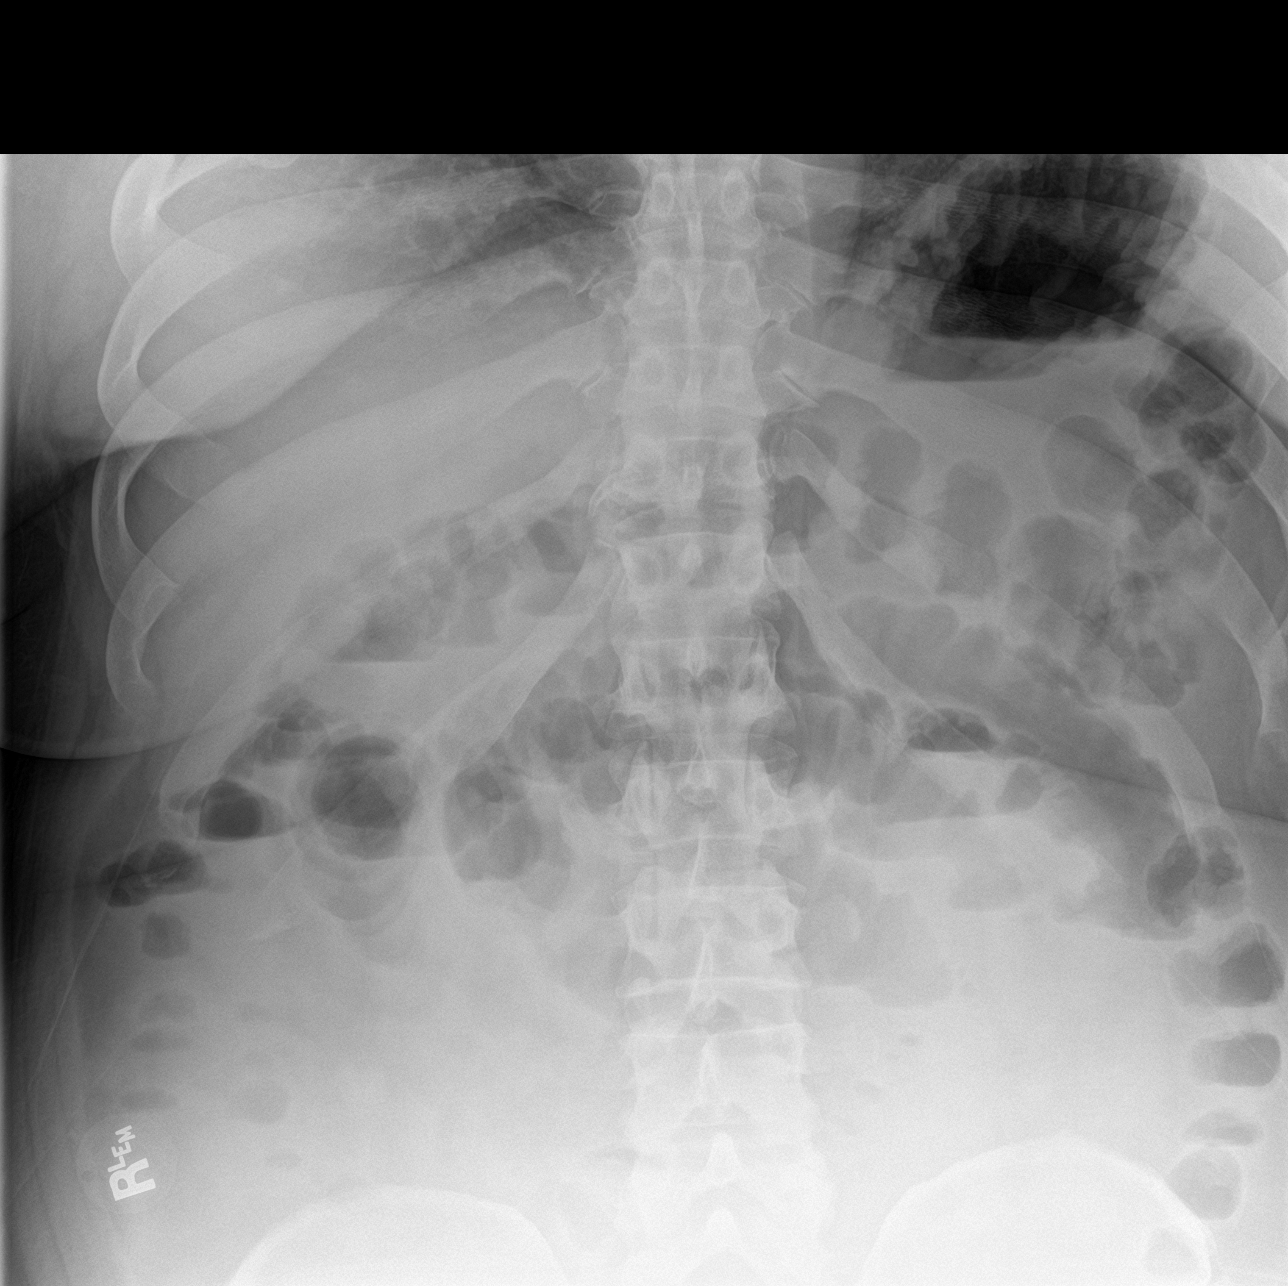
[im 2/4]
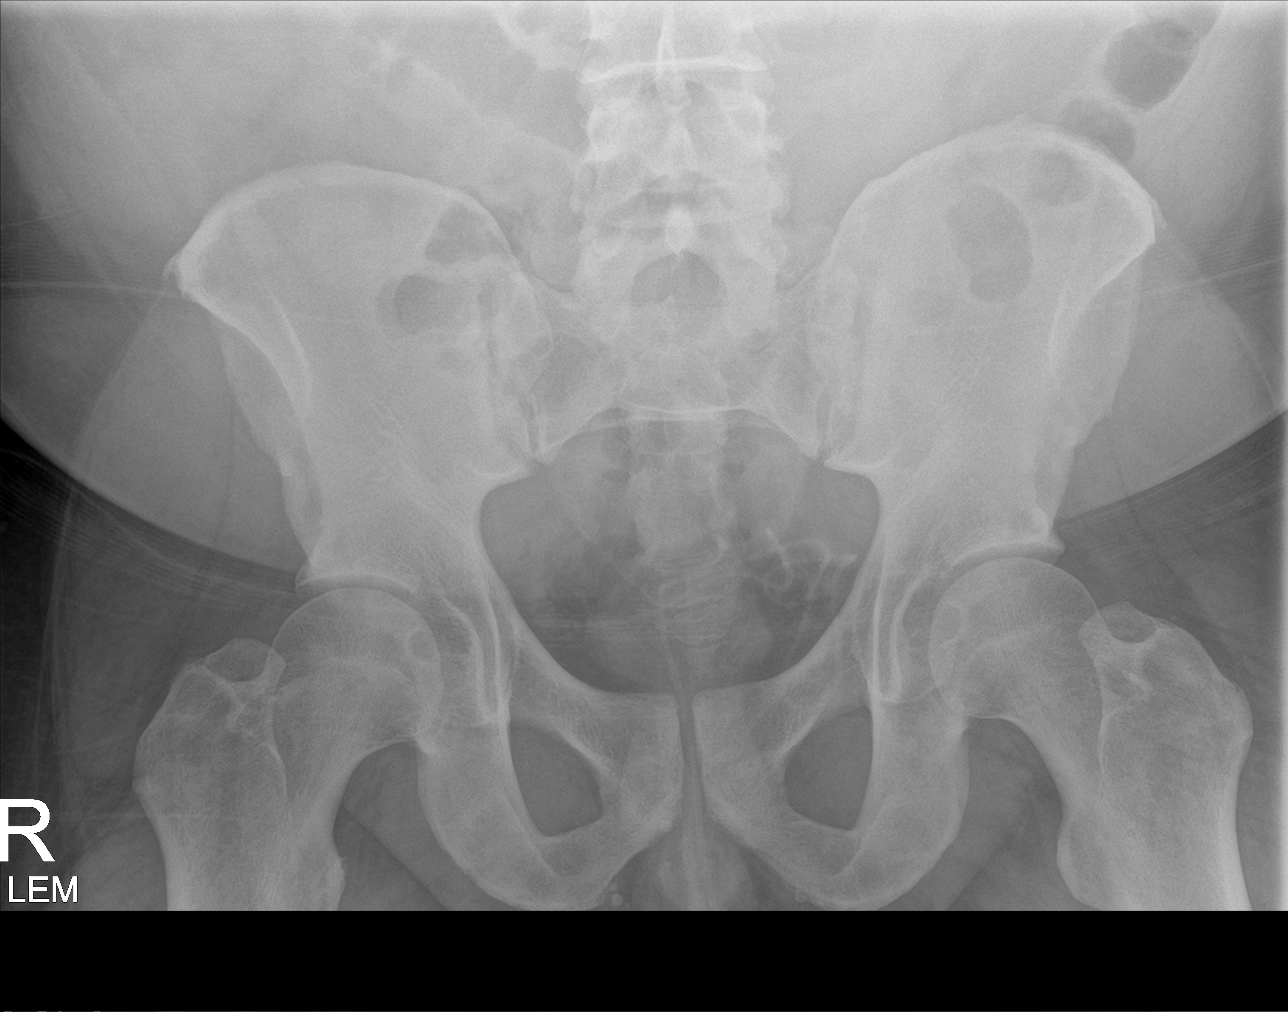
[im 3/4]
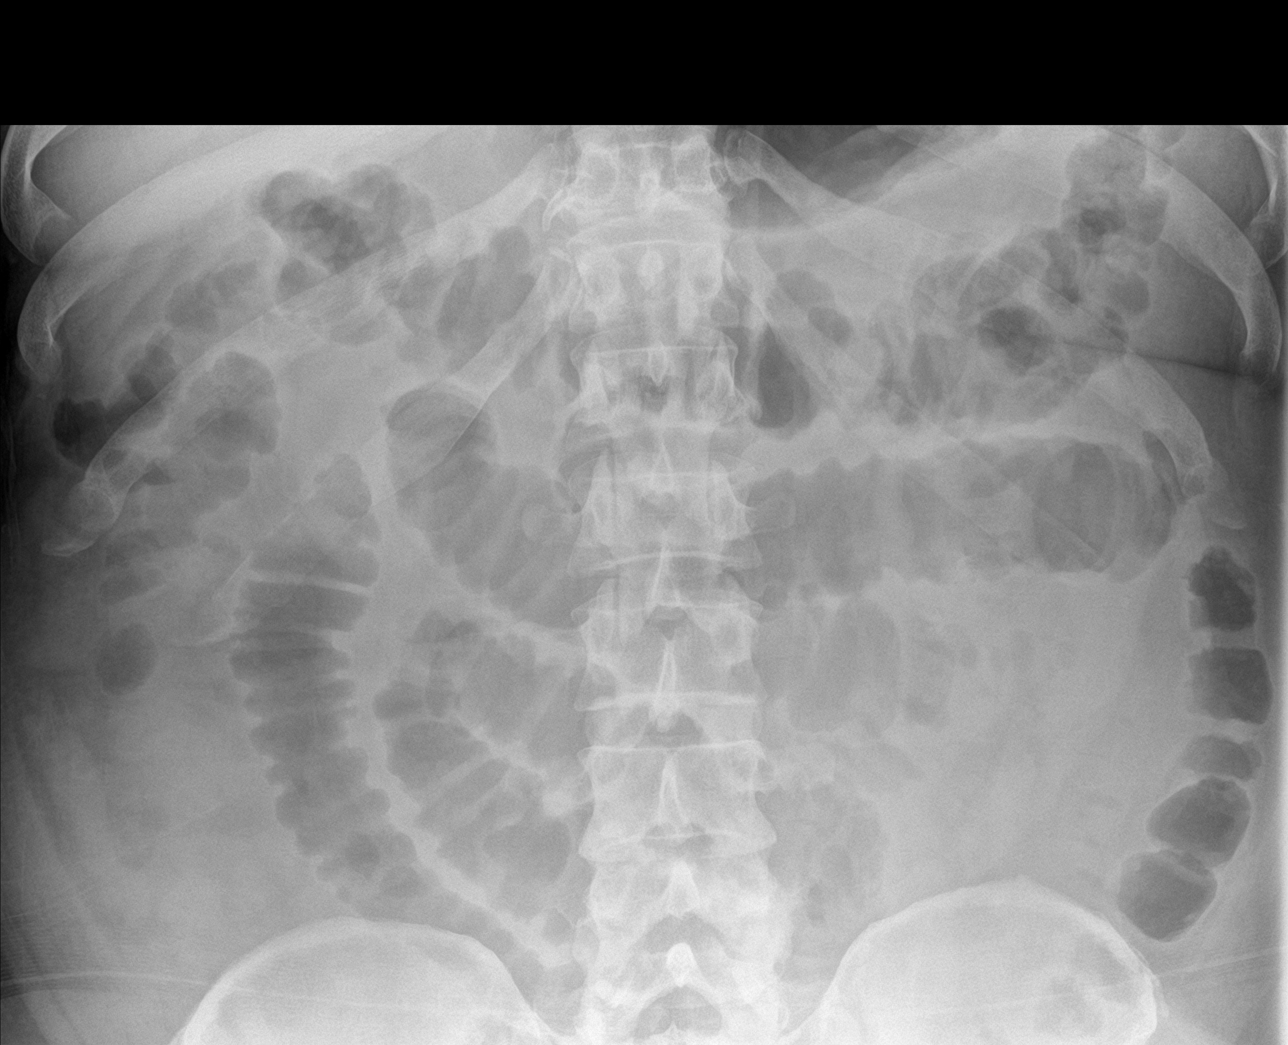
[im 4/4]
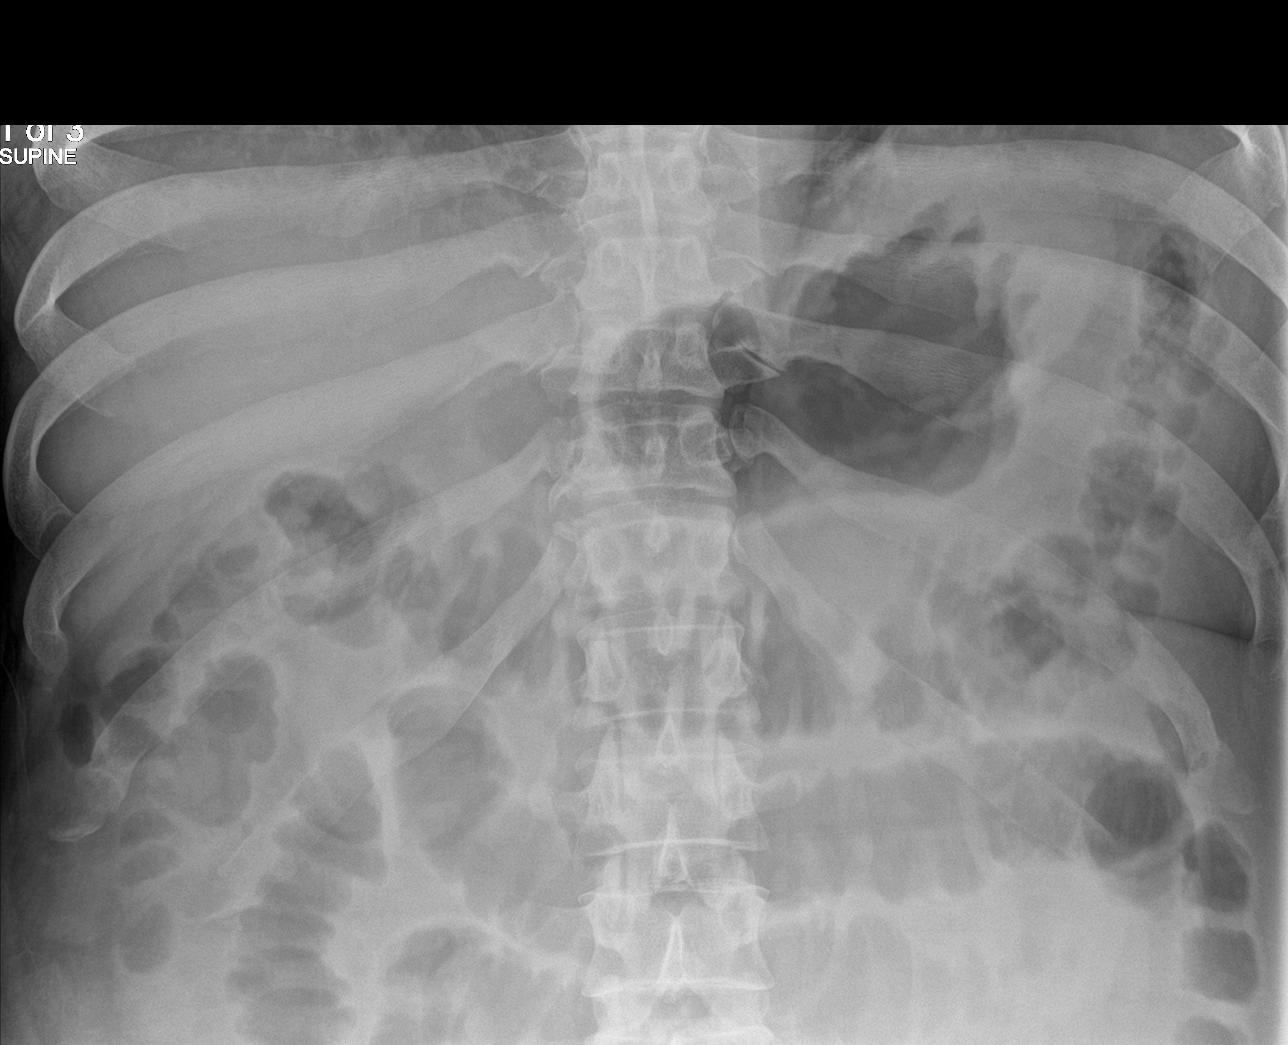

[4 of 4 positions shown; findings below may reference images not displayed]

FINDINGS: Persistent loops of gas-filled and mildly distended small bowel
throughout to the mid abdomen. Of note, there is also substantial
thickening of the valvulae conniventes consistent with submucosal
edema and enteritis. No evidence of free intra-abdominal gas. The
colon is normal in appearance with well distributed gas pattern. No
acute osseous abnormality identified.
IMPRESSION: 1. Persistent loops of gas filled and mildly dilated small bowel in
the mid abdomen with evidence of submucosal edema. Primary
differential considerations include both infectious and inflammatory
enteritis.
